# Patient Record
Sex: Female | Born: 1950 | Race: White | Hispanic: No | Marital: Married | Smoking: Never smoker
Health system: Southern US, Community
[De-identification: ages and names within clinical notes are randomized; demographics above are authoritative.]

## PROBLEM LIST (undated history)

## (undated) DIAGNOSIS — I10 Essential (primary) hypertension: Secondary | ICD-10-CM

## (undated) HISTORY — PX: GALLBLADDER SURGERY: SHX652

---

## 1998-11-14 ENCOUNTER — Other Ambulatory Visit: Admission: RE | Admit: 1998-11-14 | Discharge: 1998-11-14 | Payer: Self-pay | Admitting: *Deleted

## 2014-05-24 ENCOUNTER — Other Ambulatory Visit: Payer: Self-pay | Admitting: *Deleted

## 2014-05-24 DIAGNOSIS — R002 Palpitations: Secondary | ICD-10-CM

## 2014-05-25 ENCOUNTER — Other Ambulatory Visit: Payer: Self-pay | Admitting: *Deleted

## 2014-05-25 DIAGNOSIS — R002 Palpitations: Secondary | ICD-10-CM

## 2014-05-30 ENCOUNTER — Encounter (INDEPENDENT_AMBULATORY_CARE_PROVIDER_SITE_OTHER): Payer: BC Managed Care – PPO

## 2014-05-30 ENCOUNTER — Encounter: Payer: Self-pay | Admitting: *Deleted

## 2014-05-30 DIAGNOSIS — R002 Palpitations: Secondary | ICD-10-CM

## 2014-05-30 NOTE — Progress Notes (Signed)
Patient ID: Christina PartridgeDonna G Duran, female   DOB: 10/15/1951, [age] y.o.   MRN: [id number] [facility] 24 hour holter monitor applied to patient.

## 2014-06-09 ENCOUNTER — Other Ambulatory Visit: Payer: Self-pay | Admitting: Family Medicine

## 2014-06-09 ENCOUNTER — Other Ambulatory Visit (HOSPITAL_COMMUNITY)
Admission: RE | Admit: 2014-06-09 | Discharge: 2014-06-09 | Disposition: A | Discharge status: Home / Self Care | Payer: BC Managed Care – PPO | Source: Ambulatory Visit | Attending: Family Medicine | Admitting: Family Medicine

## 2014-06-09 DIAGNOSIS — Z124 Encounter for screening for malignant neoplasm of cervix: Secondary | ICD-10-CM | POA: Insufficient documentation

## 2014-06-12 ENCOUNTER — Other Ambulatory Visit: Payer: Self-pay | Admitting: Family Medicine

## 2014-06-12 DIAGNOSIS — Z87891 Personal history of nicotine dependence: Secondary | ICD-10-CM

## 2014-06-12 LAB — CYTOLOGY - PAP

## 2014-06-15 ENCOUNTER — Inpatient Hospital Stay: Admission: RE | Admit: 2014-06-15 | Payer: BC Managed Care – PPO | Source: Ambulatory Visit

## 2014-06-15 ENCOUNTER — Ambulatory Visit
Admission: RE | Admit: 2014-06-15 | Discharge: 2014-06-15 | Disposition: A | Discharge status: Home / Self Care | Payer: No Typology Code available for payment source | Source: Ambulatory Visit | Attending: Family Medicine | Admitting: Family Medicine

## 2014-06-15 ENCOUNTER — Inpatient Hospital Stay
Admission: RE | Admit: 2014-06-15 | Discharge: 2014-06-15 | Disposition: A | Discharge status: Home / Self Care | Payer: BC Managed Care – PPO | Source: Ambulatory Visit | Attending: Family Medicine | Admitting: Family Medicine

## 2014-06-15 DIAGNOSIS — Z87891 Personal history of nicotine dependence: Secondary | ICD-10-CM

## 2015-11-03 IMAGING — CT CT CHEST NODULE FOLLOW UP LOW DOSE W/O CM
1 of 4 series · 15 of 36 positions shown, 19 images · non-contrast
Comparison: None.

CLINICAL DATA: Ex smoker, lung cancer screening.

EXAM:
CT CHEST SCREENING WITHOUT CONTRAST
TECHNIQUE: Multidetector CT imaging of the chest was performed following the
standard low-dose protocol without IV contrast.

[Series 3: lung windows · axial · 0.62mm/px · z∈[-274,-12]mm · 15 of 233 slices shown, 19 images]
[im 12/233  mediastinal]
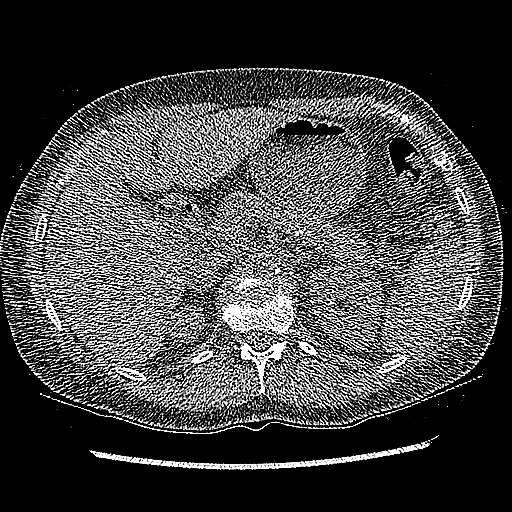
[im 12/233  lung]
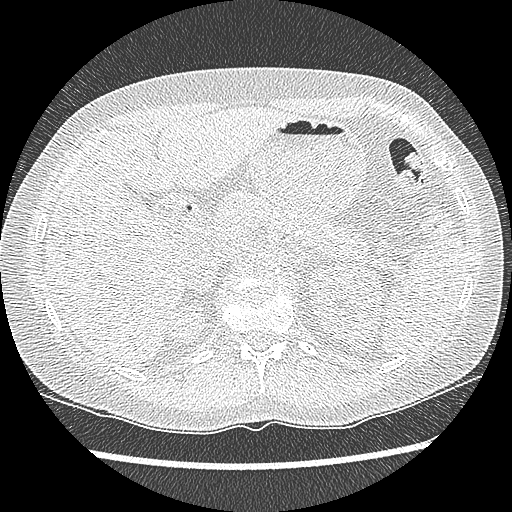
[im 34/233  lung]
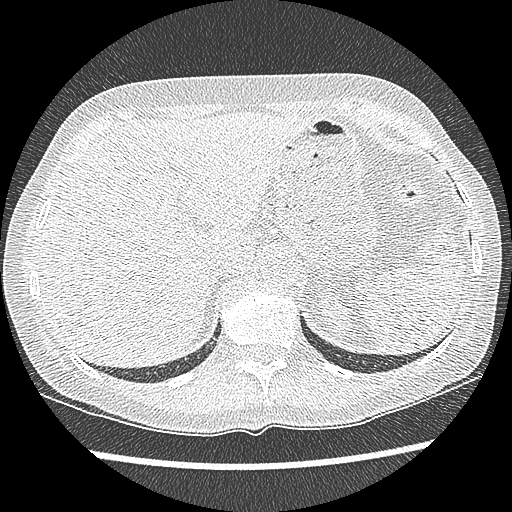
[im 45/233  lung]
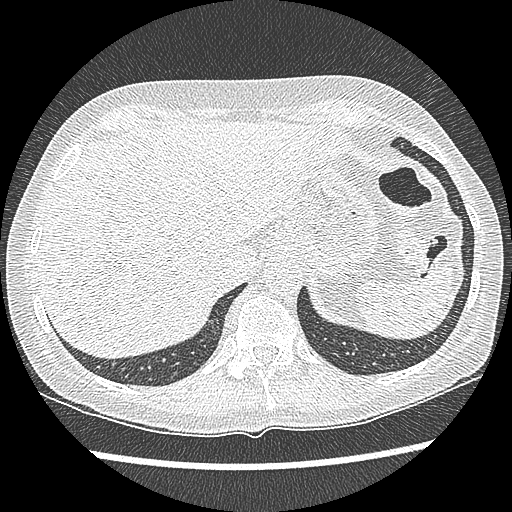
[im 56/233  lung]
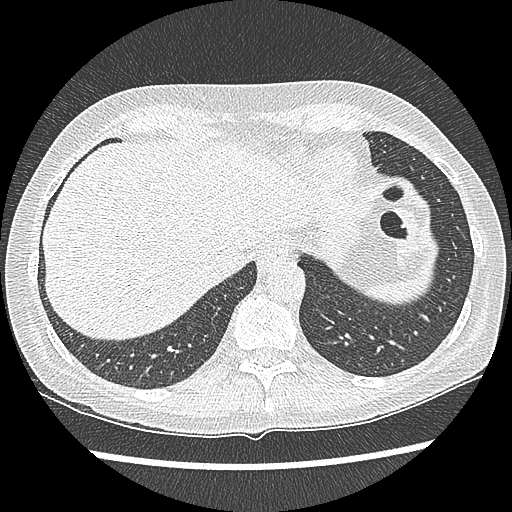
[im 78/233  mediastinal]
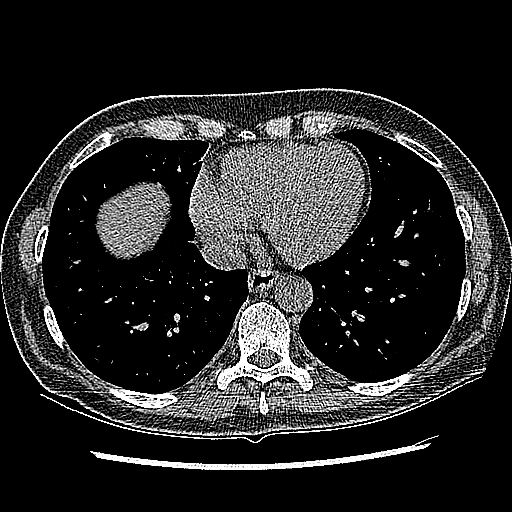
[im 78/233  lung]
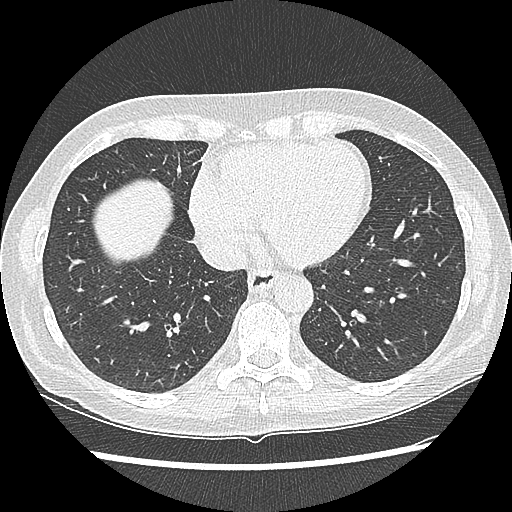
[im 89/233  lung]
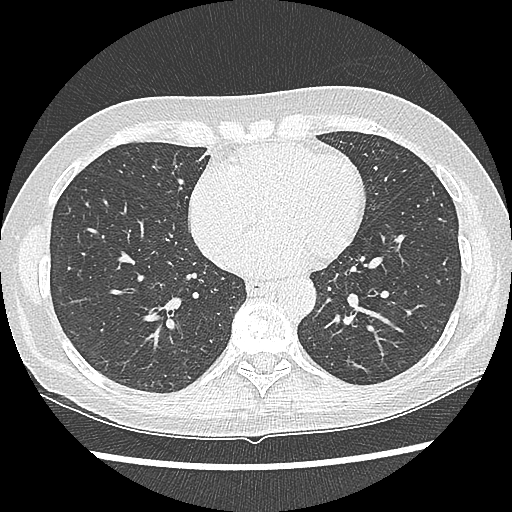
[im 100/233  lung]
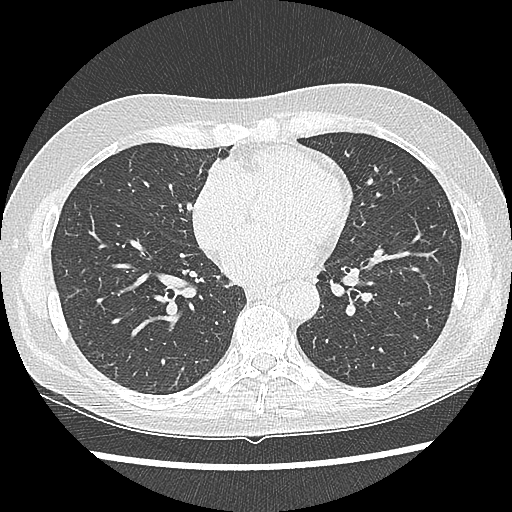
[im 122/233  lung]
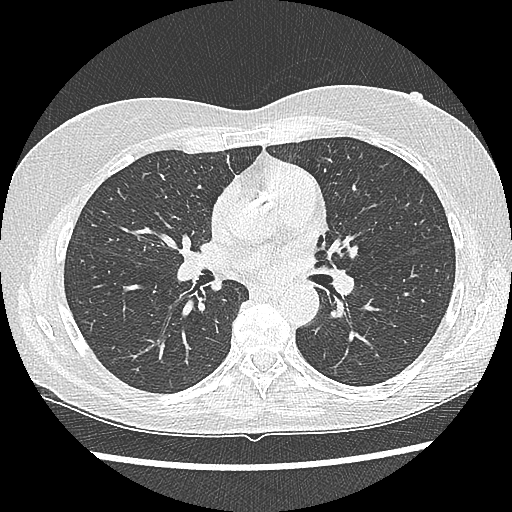
[im 133/233  mediastinal]
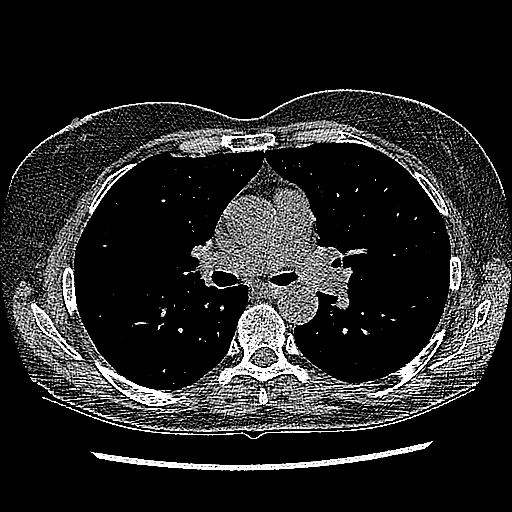
[im 133/233  lung]
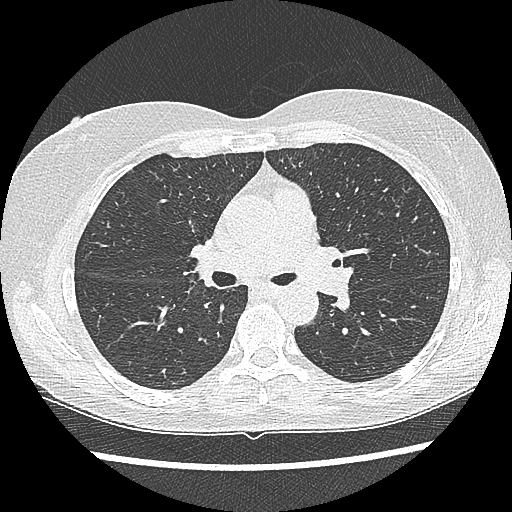
[im 144/233  lung]
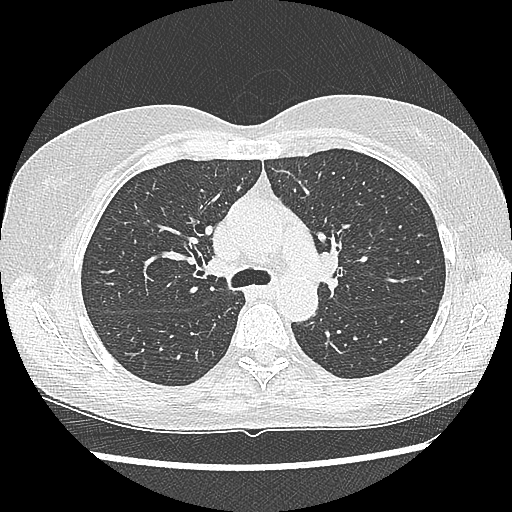
[im 166/233  lung]
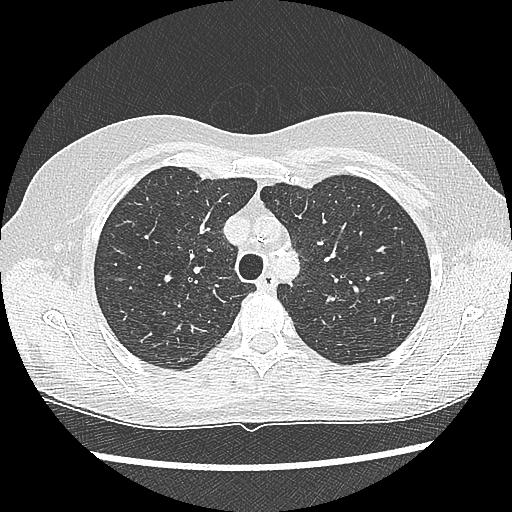
[im 177/233  lung]
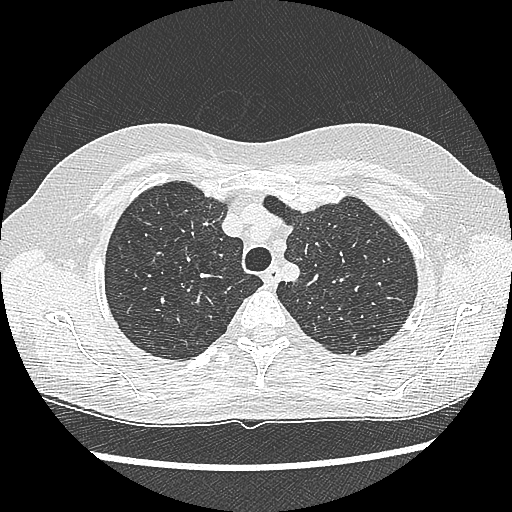
[im 188/233  mediastinal]
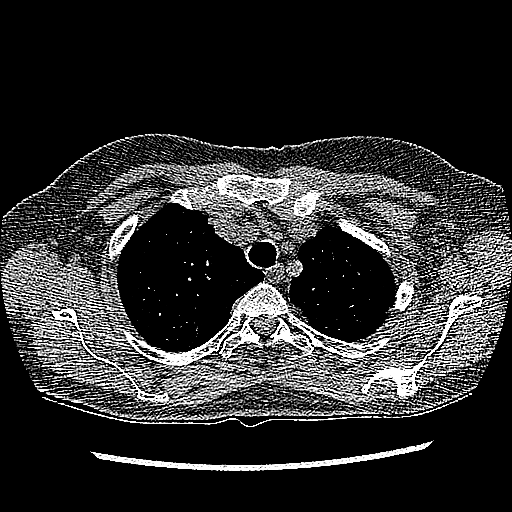
[im 188/233  lung]
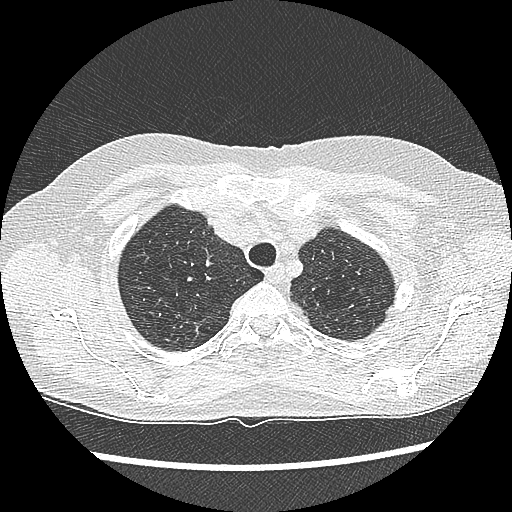
[im 210/233  lung]
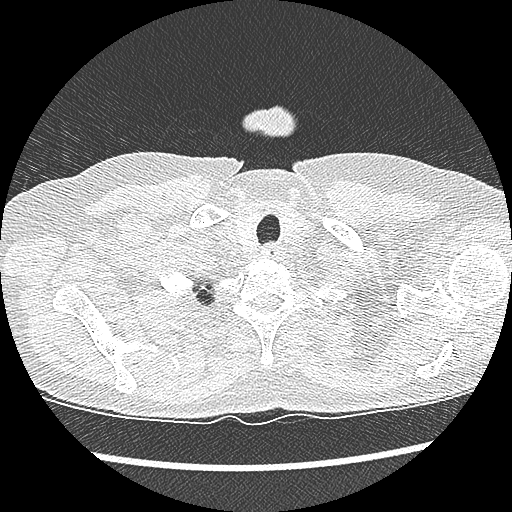
[im 221/233  lung]
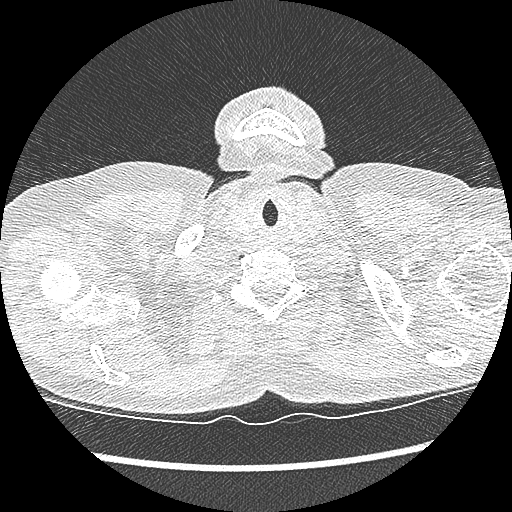

[15 of 36 positions shown; findings below may reference images not displayed]

FINDINGS: No pathologically enlarged mediastinal or axillary lymph nodes.
Hilar regions are difficult to definitively evaluate without IV
contrast but appear grossly unremarkable. Heart size normal. No
pericardial effusion.

Mild biapical pleural parenchymal scarring. Mild centrilobular
emphysema. 4 mm subpleural nodule along the left major fissure
(series 3, image 59). No dominant pulmonary nodule. No pleural
fluid. Airway is unremarkable.

Incidental imaging of the upper abdomen shows the visualized
portions of the liver, adrenal glands, kidneys, spleen, pancreas and
stomach to be grossly unremarkable. No worrisome lytic or sclerotic
lesions. Degenerative changes are seen in the spine.
IMPRESSION: Lung-RADS Category 2, benign appearance or behavior. Continue annual
screening with low-dose chest CT without contrast in 12 months.

## 2020-05-16 ENCOUNTER — Encounter (HOSPITAL_COMMUNITY): Payer: Self-pay

## 2020-05-16 ENCOUNTER — Other Ambulatory Visit: Payer: Self-pay

## 2020-05-16 ENCOUNTER — Emergency Department (HOSPITAL_COMMUNITY)
Admission: EM | Admit: 2020-05-16 | Discharge: 2020-05-16 | Disposition: A | Discharge status: Home / Self Care | Payer: Medicare Other | Attending: Emergency Medicine | Admitting: Emergency Medicine

## 2020-05-16 DIAGNOSIS — I10 Essential (primary) hypertension: Secondary | ICD-10-CM | POA: Insufficient documentation

## 2020-05-16 DIAGNOSIS — H5500 Unspecified nystagmus: Secondary | ICD-10-CM | POA: Diagnosis not present

## 2020-05-16 HISTORY — DX: Essential (primary) hypertension: I10

## 2020-05-16 LAB — CBC WITH DIFFERENTIAL/PLATELET
Abs Immature Granulocytes: 0.01 10*3/uL (ref 0.00–0.07)
Basophils Absolute: 0 10*3/uL (ref 0.0–0.1)
Basophils Relative: 0 %
Eosinophils Absolute: 0.1 10*3/uL (ref 0.0–0.5)
Eosinophils Relative: 1 %
HCT: 38.2 % (ref 36.0–46.0)
Hemoglobin: 12.3 g/dL (ref 12.0–15.0)
Immature Granulocytes: 0 %
Lymphocytes Relative: 29 %
Lymphs Abs: 1.8 10*3/uL (ref 0.7–4.0)
MCH: 31.9 pg (ref 26.0–34.0)
MCHC: 32.2 g/dL (ref 30.0–36.0)
MCV: 99.2 fL (ref 80.0–100.0)
Monocytes Absolute: 0.5 10*3/uL (ref 0.1–1.0)
Monocytes Relative: 8 %
Neutro Abs: 3.8 10*3/uL (ref 1.7–7.7)
Neutrophils Relative %: 62 %
Platelets: 183 10*3/uL (ref 150–400)
RBC: 3.85 MIL/uL — ABNORMAL LOW (ref 3.87–5.11)
RDW: 12.9 % (ref 11.5–15.5)
WBC: 6.1 10*3/uL (ref 4.0–10.5)
nRBC: 0 % (ref 0.0–0.2)

## 2020-05-16 LAB — COMPREHENSIVE METABOLIC PANEL
ALT: 28 U/L (ref 0–44)
AST: 26 U/L (ref 15–41)
Albumin: 4.5 g/dL (ref 3.5–5.0)
Alkaline Phosphatase: 64 U/L (ref 38–126)
Anion gap: 10 (ref 5–15)
BUN: 26 mg/dL — ABNORMAL HIGH (ref 8–23)
CO2: 27 mmol/L (ref 22–32)
Calcium: 9.6 mg/dL (ref 8.9–10.3)
Chloride: 100 mmol/L (ref 98–111)
Creatinine, Ser: 0.7 mg/dL (ref 0.44–1.00)
GFR calc Af Amer: >60 mL/min (ref >60)
GFR calc non Af Amer: >60 mL/min (ref >60)
Glucose, Bld: 102 mg/dL — ABNORMAL HIGH (ref 70–99)
Potassium: 4.3 mmol/L (ref 3.5–5.1)
Sodium: 137 mmol/L (ref 135–145)
Total Bilirubin: 0.5 mg/dL (ref 0.3–1.2)
Total Protein: 7.2 g/dL (ref 6.5–8.1)

## 2020-05-16 MED ORDER — SODIUM CHLORIDE 0.9 % IV BOLUS
1000.0000 mL | Freq: Once | INTRAVENOUS | Status: AC
  Administered 2020-05-16: 1000 mL via INTRAVENOUS

## 2020-05-16 MED ORDER — BENAZEPRIL HCL 10 MG PO TABS
10.0000 mg | ORAL_TABLET | Freq: Every day | ORAL | Status: DC
  Administered 2020-05-16: 10 mg via ORAL
  Filled 2020-05-16: qty 1

## 2020-05-16 MED ORDER — MECLIZINE HCL 25 MG PO TABS: 25.0000 mg | ORAL_TABLET | Freq: Three times a day (TID) | ORAL | 0 refills | Status: AC | PRN

## 2020-05-16 NOTE — Medical Student Note (Signed)
[facility]-EMERGENCY DEPT Provider Student Note For educational purposes for Medical, PA and NP students only and not part of the legal medical record.   CSN: [id number] Arrival date & time: 05/16/20  1700      History   Chief Complaint Chief Complaint  Patient presents with  . Hypertension    HPI Christina Duran is a 69 y.o. female presenting for dizziness and hypertension that began this morning when she was golfing. She reports that when she woke up this morning, she checked her BP at home and SBP in the 160s. She went to golf today and after she swung and hit the ball, she turned around and felt dizzy and like things were spinning. She denies syncope, hitting her head, changes in vision,  Blurry vision, headache, prodrome of warmth at time of incident. She went back to the golf cart and clubhouse and had no improvement in sx so she called EMS. She says she had one episode of vertigo in the past that was treated with the Epley maneuver and it feels similar to that episode, along with another episode of dizziness she had in the past with food poisoning. She reports that the dizziness subsided after about 30 minutes and she denies current dizziness or vertigo. Upon arrival to [facility], BP 178/90. She reports taking benzepril 10 mg nightly and takes her medication regularly and does not miss dosing. She reports HL and takes statin. She denies DM or other cardiac history.  She denies vision changes, HA, syncope, loss of consciousness, weakness in extremities.   HPI  Past Medical History:  Diagnosis Date  . Hypertension     There are no problems to display for this patient.   Past Surgical History:  Procedure Laterality Date  . GALLBLADDER SURGERY      OB History   No obstetric history on file.      Home Medications    Prior to Admission medications   Medication Sig Start Date End Date Taking? Authorizing Provider  meclizine (ANTIVERT) 25 MG tablet Take 1 tablet (25 mg total) by mouth 3  (three) times daily as needed for dizziness. 05/16/20   Maxwell Caul, PA-C    Family History History reviewed. No pertinent family history.  Social History Social History   Tobacco Use  . Smoking status: Never Smoker  . Smokeless tobacco: Never Used  Substance Use Topics  . Alcohol use: Not Currently  . Drug use: Not on file     Allergies   Patient has no known allergies.   Review of Systems Review of Systems  Constitutional: Negative for activity change and fever.  HENT: Negative for rhinorrhea and sore throat.   Respiratory: Negative for cough and shortness of breath.   Cardiovascular: Negative for chest pain and palpitations.  Gastrointestinal: Positive for nausea. Negative for abdominal pain, constipation, diarrhea and vomiting.  Neurological: Positive for dizziness and light-headedness. Negative for syncope, weakness and headaches.     Physical Exam Updated Vital Signs BP (!) 188/97   Pulse 75   Temp 97.8 F (36.6 C) (Oral)   Resp 14   Ht 5\' 2"  (1.575 m)   Wt 54.4 kg   SpO2 100%   BMI 21.95 kg/m   Physical Exam Constitutional:      Appearance: Normal appearance. She is not ill-appearing or diaphoretic.  HENT:     Head: Normocephalic.  Eyes:     Extraocular Movements: Extraocular movements intact.     Pupils: Pupils are equal, round,  and reactive to light.  Neck:     Vascular: No carotid bruit.  Cardiovascular:     Rate and Rhythm: Normal rate and regular rhythm.     Pulses: Normal pulses.     Heart sounds: Normal heart sounds.  Pulmonary:     Effort: Pulmonary effort is normal.     Breath sounds: Normal breath sounds.  Abdominal:     General: Abdomen is flat. Bowel sounds are normal.  Musculoskeletal:        General: Normal range of motion.     Cervical back: Normal range of motion and neck supple.  Skin:    General: Skin is warm and dry.  Neurological:     General: No focal deficit present.     Mental Status: She is alert and oriented  to person, place, and time.     Cranial Nerves: No cranial nerve deficit.     Sensory: No sensory deficit.     Motor: No weakness.     Coordination: Coordination normal.     Gait: Gait normal.     Comments: Left sided vertical nystagmus, no horizontal or bilateral nystagmus.       ED Treatments / Results  Labs (all labs ordered are listed, but only abnormal results are displayed) Labs Reviewed  COMPREHENSIVE METABOLIC PANEL - Abnormal; Notable for the following components:      Result Value   Glucose, Bld 102 (*)    BUN 26 (*)    All other components within normal limits  CBC WITH DIFFERENTIAL/PLATELET - Abnormal; Notable for the following components:   RBC 3.85 (*)    All other components within normal limits    EKG  Radiology No results found.  Procedures Procedures (including critical care time)  Medications Ordered in ED Medications  benazepril (LOTENSIN) tablet 10 mg (10 mg Oral Given 05/16/20 1855)  sodium chloride 0.9 % bolus 1,000 mL (0 mLs Intravenous Stopped 05/16/20 2000)     Initial Impression / Assessment and Plan / ED Course  I have reviewed the triage vital signs and the nursing notes.  Pertinent labs & imaging results that were available during my care of the patient were reviewed by me and considered in my medical decision making (see chart for details).  Clinical Course as of May 16 2030  Wed May 16, 2020  3360 69 year old female with known hypertension here with elevated blood pressures in the setting of having a dizzy spell while golfing. She has had vertigo before. Symptoms have resolved although her blood pressure remains elevated. Labs with no evidence of endorgan damage. She is comfortable following up with her doctor. Return instructions discussed   [MB]    Clinical Course User Index [MB] Terrilee Files, MD   Pt is a 69 yo female presenting with vertigo and hypertension. She had resolution of sx in ED while in triage. She has no recent  history of vertigo and distant hx of vertigo necessitating epley maneuver with resolution of sx. HTN pt currently takes benzepril 10 mg and follows PCP in [place]. She reports no recent medication changes. Denies HA, visual changes, difficulty urinating. Concern low for htn emergency given normal basic labs and no visual sx. Pt normal neuro exam aside from presence of horizontal left beating nystagmus. DDX concern for BPPV, dehydration. Low concern for CVA given normal neuro exam and lack of other persistent neuro deficits.  Pt was given a bolus of IVF for rehydration.  While in ED, SBP 170s and  upper 160s. Pt was given her home medication 10 mg benzepril and BP still elevated SBP in 180s. Pt is asx without HA or visual changes and no recurrent episodes of dizziness or vertigo. Pt is ambulatory in the room and given neuro exam, do not see necessary for CT or MRI brain- discussed with attending who is in agreement. Pt agrees with plan and was given opportunity to ask questions. Advised with strict return precautions including onset of visual symptoms, or headache, or persistently elevated BP readings with sx. Advised to follow up with PCP and seek possible medication adjustments tomorrow.  Pt will be discharged with short course of meclizine for vertigo.    Final Clinical Impressions(s) / ED Diagnoses   Final diagnoses:  Essential hypertension    New Prescriptions Discharge Medication List as of 05/16/2020  7:49 PM    START taking these medications   Details  meclizine (ANTIVERT) 25 MG tablet Take 1 tablet (25 mg total) by mouth 3 (three) times daily as needed for dizziness., Starting Wed 05/16/2020, Normal

## 2020-05-16 NOTE — ED Provider Notes (Signed)
[facility]-EMERGENCY DEPT Provider Note   CSN: [id number] Arrival date & time: 05/16/20  1700     History Chief Complaint  Patient presents with  . Hypertension    Christina Duran is a 69 y.o. female who presents for evaluation of hypertension.  Patient reports he has a history of hypertension that is controlled with benazepril.  She reports that he she takes her nightly medications.  She noticed this morning, her blood pressure was in the one sixties.  She states that she continue to monitor it.  She went out golfing and states that as she was turning and came back to swing at the ball, she had an episode of dizziness.  She states she felt like everything around her was spinning and she got nauseous.  She had had previous episodes of vertigo and states that it felt similar.  She states that she tried to sit down to see if this feeling would pass but states that it persisted for about 30 minutes.  She states that it persisted so she called EMS.  She states that she did not fall down today.  Denies any head injury.  On EMS transport, she reports that the dizziness sensation resolved.  Patient currently denies any dizziness.  She states during this episode, she never had any chest pain, difficulty breathing, numbness/weakness of arms or legs, vision changes.  She has not taken her daily blood pressure medications today.  She denies any abdominal pain, nausea/vomiting.   The history is provided by the patient.       Past Medical History:  Diagnosis Date  . Hypertension     There are no problems to display for this patient.   Past Surgical History:  Procedure Laterality Date  . GALLBLADDER SURGERY       OB History   No obstetric history on file.     History reviewed. No pertinent family history.  Social History   Tobacco Use  . Smoking status: Never Smoker  . Smokeless tobacco: Never Used  Substance Use Topics  . Alcohol use: Not Currently  . Drug use:  Not on file    Home Medications Prior to Admission medications   Medication Sig Start Date End Date Taking? Authorizing Provider  meclizine (ANTIVERT) 25 MG tablet Take 1 tablet (25 mg total) by mouth 3 (three) times daily as needed for dizziness. 05/16/20   Maxwell Caul, PA-C    Allergies    Patient has no known allergies.  Review of Systems   Review of Systems  Constitutional: Negative for fever.  Respiratory: Negative for cough and shortness of breath.   Cardiovascular: Negative for chest pain.  Gastrointestinal: Negative for abdominal pain, nausea and vomiting.  Genitourinary: Negative for dysuria and hematuria.  Neurological: Positive for dizziness (Resolved). Negative for weakness, numbness and headaches.  All other systems reviewed and are negative.   Physical Exam Updated Vital Signs BP (!) 188/97   Pulse 75   Temp 97.8 F (36.6 C) (Oral)   Resp 14   Ht 5\' 2"  (1.575 m)   Wt 54.4 kg   SpO2 100%   BMI 21.95 kg/m   Physical Exam Vitals and nursing note reviewed.  Constitutional:      Appearance: Normal appearance. She is well-developed.  HENT:     Head: Normocephalic and atraumatic.  Eyes:     General: Lids are normal.     Conjunctiva/sclera: Conjunctivae normal.     Pupils: Pupils are equal, round, and  reactive to light.     Comments: Left-sided horizontal nystagmus.  No vertical or nystagmus or rotational nystagmus noted.  Cardiovascular:     Rate and Rhythm: Normal rate and regular rhythm.     Pulses: Normal pulses.     Heart sounds: Normal heart sounds. No murmur heard.  No friction rub. No gallop.   Pulmonary:     Effort: Pulmonary effort is normal.     Breath sounds: Normal breath sounds.     Comments: Lungs clear to auscultation bilaterally.  Symmetric chest rise.  No wheezing, rales, rhonchi. Abdominal:     Palpations: Abdomen is soft. Abdomen is not rigid.     Tenderness: There is no abdominal tenderness. There is no guarding.     Comments:  Abdomen is soft, non-distended, non-tender. No rigidity, No guarding. No peritoneal signs.  Musculoskeletal:        General: Normal range of motion.     Cervical back: Full passive range of motion without pain.  Skin:    General: Skin is warm and dry.     Capillary Refill: Capillary refill takes less than 2 seconds.  Neurological:     Mental Status: She is alert and oriented to person, place, and time.     Comments: Cranial nerves III-XII intact Follows commands, Moves all extremities  5/5 strength to BUE and BLE  Sensation intact throughout all major nerve distributions Normal finger to nose. No pronator drift. No gait abnormalities  No slurred speech. No facial droop.   Psychiatric:        Speech: Speech normal.     ED Results / Procedures / Treatments   Labs (all labs ordered are listed, but only abnormal results are displayed) Labs Reviewed  COMPREHENSIVE METABOLIC PANEL - Abnormal; Notable for the following components:      Result Value   Glucose, Bld 102 (*)    BUN 26 (*)    All other components within normal limits  CBC WITH DIFFERENTIAL/PLATELET - Abnormal; Notable for the following components:   RBC 3.85 (*)    All other components within normal limits    EKG EKG Interpretation  Date/Time:  Wednesday May 16 2020 17:59:02 EDT Ventricular Rate:  75 PR Interval:    QRS Duration: 88 QT Interval:  384 QTC Calculation: 429 R Axis:   54 Text Interpretation: Sinus rhythm Probable left atrial enlargement ST elevation, consider inferior injury No old tracing to compare Confirmed by Meridee Score [phone]) on 05/16/2020 6:04:21 PM   Radiology No results found.  Procedures Procedures (including critical care time)  Medications Ordered in ED Medications  benazepril (LOTENSIN) tablet 10 mg (10 mg Oral Given 05/16/20 1855)  sodium chloride 0.9 % bolus 1,000 mL (0 mLs Intravenous Stopped 05/16/20 2000)    ED Course  I have reviewed the triage vital signs and the  nursing notes.  Pertinent labs & imaging results that were available during my care of the patient were reviewed by me and considered in my medical decision making (see chart for details).  Clinical Course as of May 16 2146  Wed May 16, 2020  6042 69 year old female with known hypertension here with elevated blood pressures in the setting of having a dizzy spell while golfing. She has had vertigo before. Symptoms have resolved although her blood pressure remains elevated. Labs with no evidence of endorgan damage. She is comfortable following up with her doctor. Return instructions discussed   [MB]    Clinical Course User Index [MB] Charm Barges,  Kayleen Memos, MD   MDM Rules/Calculators/A&P                          69 year old female past mixture of hypertension who presents for evaluation of hypertension as well as dizziness that occurred earlier.  Noted her blood pressure to be elevated this morning.  Went out golfing and states that she twisted and turned came and hit the ball, she had an episode of dizziness that persisted for about 30 minutes.  He states that she felt like everything around her was spinning.  Associate with nausea.  No chest pain, difficulty breathing.  She called EMS for further evaluation.  She reports that in route to the ED, the dizziness/vertigo sensation resolved.  Currently denies any dizziness.  No chest pain, difficulty breathing, vision changes, numbness/weakness of arms or legs.  On initial arrival, she is afebrile, nontoxic-appearing.  She is slightly hypertensive.  Vital signs are stable.  She has not had her nightly medications for high blood pressure.  On exam, she has no neurological deficits.  She does have evidence of left-sided horizontal nystagmus.  I ambulated the patient in the ED with no signs of gait ataxia and denies any dizziness.  Question if this was onset of vertigo with the twisting and turning motion of swinging the golf ball versus dehydration.   History/physical exam is not concerning for CVA as her symptoms are not persistent.  We will plan to check labs, hydrate patient.  CBC shows no leukocytosis.  Hemoglobin stable at 12.3.  BUN is slightly elevated 26.  Could represent dehydration.  Patient still hypertensive.  Blood pressure 176/94.  She currently denies any symptoms at this time.  Discussed results with patient.  Patient reports feeling better.  She is ambulatory in the ED without any signs of gait ataxia or dizziness.  At this time, do not suspect that this is CVA as etiology of her symptoms.  She is not having any dizziness at this time and is able to ambulate in the department without any abnormalities.  Additionally, her symptoms have been episodic in nature.  They were not persistent.  Do not feel that MRI brain or CT head is warranted at this time. Discussed patient with Dr. Charm Barges who evaluated the patient and is agreeable. Patient had ample opportunity for questions and discussion. All patient's questions were answered with full understanding. Strict return precautions discussed. Patient expresses understanding and agreement to plan.   Portions of this note were generated with Scientist, clinical (histocompatibility and immunogenetics). Dictation errors may occur despite best attempts at proofreading.   Final Clinical Impression(s) / ED Diagnoses Final diagnoses:  Essential hypertension    Rx / DC Orders ED Discharge Orders         Ordered    meclizine (ANTIVERT) 25 MG tablet  3 times daily PRN     Discontinue  Reprint     05/16/20 1948           Maxwell Caul, PA-C 05/16/20 2147    Terrilee Files, MD 05/17/20 [phone]

## 2020-05-16 NOTE — Discharge Instructions (Signed)
As we discussed, your work-up today was reassuring.  Take your blood pressure medication as directed.  As we discussed, call your primary care doctor tomorrow for reevaluation.  I provided you meclizine that you can take as needed if you get vertigo symptoms.  Return to emergency department for any chest pain, difficulty breathing, difficulty walking, nausea/vomiting, dizziness or any other worsening or concerning symptoms.

## 2020-05-16 NOTE — ED Triage Notes (Signed)
Pt arrived via EMS for hypertension, per EMS pt has hx of HTN, very well controlled with her home medications. Pt states she has noticed her BP start to increase this morning 160's/90's, went out golfing, got dizzy, checked BP and it was 200's/100's. Endorses slight nausea.  Pt denies any SOB, chest pain, headache or dizziness in triage.

## 2021-09-25 ENCOUNTER — Ambulatory Visit (INDEPENDENT_AMBULATORY_CARE_PROVIDER_SITE_OTHER): Payer: Medicare Other | Admitting: Dermatology

## 2021-09-25 ENCOUNTER — Encounter: Payer: Self-pay | Admitting: Dermatology

## 2021-09-25 ENCOUNTER — Other Ambulatory Visit: Payer: Self-pay

## 2021-09-25 DIAGNOSIS — L80 Vitiligo: Secondary | ICD-10-CM | POA: Diagnosis not present

## 2021-09-25 MED ORDER — CLOBETASOL PROPIONATE 0.05 % EX CREA
TOPICAL_CREAM | CUTANEOUS | 0 refills | Status: DC
Start: 2021-09-25 — End: 2021-12-09

## 2021-09-25 NOTE — Patient Instructions (Addendum)
TSH test for thyroid needed, check records from physical exam

## 2021-10-14 ENCOUNTER — Encounter: Payer: Self-pay | Admitting: Dermatology

## 2021-10-14 NOTE — Addendum Note (Signed)
Addended by: [name] on: 10/14/2021 04:51 AM   Modules accepted: Level of Service

## 2021-10-14 NOTE — Progress Notes (Signed)
° °  Follow-Up Visit   Subjective  Christina Duran is a 70 y.o. female who presents for the following: Vitiligo (Lower legs vitiligo what's new for treatment).  Vitiligo Location:  Duration:  Quality:  Associated Signs/Symptoms: Modifying Factors:  Severity:  Timing: Context:   Objective  Well appearing patient in no apparent distress; mood and affect are within normal limits. Left Lower Leg - Anterior, Right Lower Leg - Anterior Macular depigmentation involving 50+% of both shins. Space spared. Patient uncertain of last thyroid lab test.    All sun exposed areas plus back examined.  Plus legs   Assessment & Plan    Vitiligo Left Lower Leg - Anterior; Right Lower Leg - Anterior  Goodrx coupon given for clobetasol. Discussed essentially all treatment options from light based to blister grafts to new data on JAK inhibitors. For now will go with 3-6 months of daily application of clobetasol after bathing, sun protection.  Related Medications clobetasol cream (TEMOVATE) 0.05 % Apply nightly after bath   Addendum, no thyroid studies reported since July 2021 in her epic record.   I, [name], MD, have reviewed all documentation for this visit.  The documentation on 10/14/21 for the exam, diagnosis, procedures, and orders are all accurate and complete.

## 2021-12-09 ENCOUNTER — Other Ambulatory Visit: Payer: Self-pay | Admitting: *Deleted

## 2021-12-09 DIAGNOSIS — L80 Vitiligo: Secondary | ICD-10-CM

## 2021-12-09 MED ORDER — CLOBETASOL PROPIONATE 0.05 % EX CREA: TOPICAL_CREAM | CUTANEOUS | 0 refills | Status: DC

## 2021-12-28 ENCOUNTER — Other Ambulatory Visit: Payer: Self-pay | Admitting: Dermatology

## 2021-12-28 DIAGNOSIS — L80 Vitiligo: Secondary | ICD-10-CM

## 2022-03-11 ENCOUNTER — Ambulatory Visit: Payer: Medicare Other | Admitting: Dermatology

## 2022-05-21 ENCOUNTER — Ambulatory Visit (INDEPENDENT_AMBULATORY_CARE_PROVIDER_SITE_OTHER): Payer: Medicare Other | Admitting: Dermatology

## 2022-05-21 ENCOUNTER — Encounter: Payer: Self-pay | Admitting: Dermatology

## 2022-05-21 DIAGNOSIS — L80 Vitiligo: Secondary | ICD-10-CM

## 2022-05-21 DIAGNOSIS — L570 Actinic keratosis: Secondary | ICD-10-CM

## 2022-06-14 ENCOUNTER — Encounter: Payer: Self-pay | Admitting: Dermatology

## 2022-06-14 NOTE — Progress Notes (Signed)
   Follow-Up Visit   Subjective  Christina Duran is a 71 y.o. female who presents for the following: Vitiligo (Follow up 6 months ).  Check spot on chest, discussed vitiligo Location:  Duration:  Quality:  Associated Signs/Symptoms: Modifying Factors:  Severity:  Timing: Context:   Objective  Well appearing patient in no apparent distress; mood and affect are within normal limits. Generalized, Right Leg We again discussed newer treatment options for vitiligo including Opzelura and systemic JA K inhibitors.  Chest (Upper Torso, Anterior) Gritty pink 4 mm crust    A focused examination was performed including head, neck, arms, back, chest, legs. Relevant physical exam findings are noted in the Assessment and Plan.   Assessment & Plan    AK (actinic keratosis) Chest (Upper Torso, Anterior)  Destruction of lesion - Chest (Upper Torso, Anterior) Complexity: simple   Destruction method: cryotherapy   Informed consent: discussed and consent obtained   Timeout:  patient name, date of birth, surgical site, and procedure verified Lesion destroyed using liquid nitrogen: Yes   Region frozen until ice ball extended beyond lesion: Yes   Cryotherapy cycles:  3 Outcome: patient tolerated procedure well with no complications   Post-procedure details: wound care instructions given    Vitiligo Generalized; Right Leg  She may use topical Temovate except on face and body folds.  She was told that this therapy takes at least 6 months and a minority of people get excellent repigmentation.  Related Medications clobetasol cream (TEMOVATE) 0.05 % APPLY TO AFFECTED AREA(S) NIGHTLY AFTER BATHING      I, Christina Harder, MD, have reviewed all documentation for this visit.  The documentation on 06/14/22 for the exam, diagnosis, procedures, and orders are all accurate and complete.
